# Patient Record
Sex: Female | Born: 1965 | Hispanic: No | Marital: Single | Smoking: Never smoker
Health system: Southern US, Community
[De-identification: ages and names within clinical notes are randomized; demographics above are authoritative.]

## PROBLEM LIST (undated history)

## (undated) HISTORY — PX: ABDOMINAL HYSTERECTOMY: SHX81

---

## 1999-02-03 ENCOUNTER — Other Ambulatory Visit: Admission: RE | Admit: 1999-02-03 | Discharge: 1999-02-03 | Payer: Self-pay | Admitting: Obstetrics and Gynecology

## 2000-03-10 ENCOUNTER — Other Ambulatory Visit: Admission: RE | Admit: 2000-03-10 | Discharge: 2000-03-10 | Payer: Self-pay | Admitting: *Deleted

## 2000-06-23 ENCOUNTER — Encounter: Payer: Self-pay | Admitting: Gastroenterology

## 2000-06-23 ENCOUNTER — Ambulatory Visit (HOSPITAL_COMMUNITY): Admission: RE | Admit: 2000-06-23 | Discharge: 2000-06-23 | Payer: Self-pay | Admitting: Gastroenterology

## 2000-09-19 ENCOUNTER — Emergency Department (HOSPITAL_COMMUNITY): Admission: EM | Admit: 2000-09-19 | Discharge: 2000-09-19 | Payer: Self-pay | Admitting: Emergency Medicine

## 2001-05-01 ENCOUNTER — Other Ambulatory Visit: Admission: RE | Admit: 2001-05-01 | Discharge: 2001-05-01 | Payer: Self-pay | Admitting: *Deleted

## 2001-08-17 ENCOUNTER — Encounter: Payer: Self-pay | Admitting: Occupational Medicine

## 2001-08-17 ENCOUNTER — Encounter: Admission: RE | Admit: 2001-08-17 | Discharge: 2001-08-17 | Payer: Self-pay | Admitting: Occupational Medicine

## 2001-08-23 ENCOUNTER — Encounter: Admission: RE | Admit: 2001-08-23 | Discharge: 2001-09-18 | Payer: Self-pay | Admitting: Occupational Medicine

## 2002-06-19 ENCOUNTER — Other Ambulatory Visit: Admission: RE | Admit: 2002-06-19 | Discharge: 2002-06-19 | Payer: Self-pay | Admitting: *Deleted

## 2002-11-06 ENCOUNTER — Encounter: Payer: Self-pay | Admitting: Family Medicine

## 2002-11-06 ENCOUNTER — Encounter: Admission: RE | Admit: 2002-11-06 | Discharge: 2002-11-06 | Payer: Self-pay | Admitting: Family Medicine

## 2003-08-06 ENCOUNTER — Other Ambulatory Visit: Admission: RE | Admit: 2003-08-06 | Discharge: 2003-08-06 | Payer: Self-pay | Admitting: *Deleted

## 2004-02-25 ENCOUNTER — Encounter: Admission: RE | Admit: 2004-02-25 | Discharge: 2004-02-25 | Payer: Self-pay | Admitting: *Deleted

## 2004-04-07 ENCOUNTER — Ambulatory Visit (HOSPITAL_COMMUNITY): Admission: RE | Admit: 2004-04-07 | Discharge: 2004-04-07 | Payer: Self-pay

## 2004-12-26 ENCOUNTER — Emergency Department (HOSPITAL_COMMUNITY): Admission: EM | Admit: 2004-12-26 | Discharge: 2004-12-26 | Payer: Self-pay | Admitting: Emergency Medicine

## 2005-09-23 ENCOUNTER — Ambulatory Visit (HOSPITAL_COMMUNITY): Admission: RE | Admit: 2005-09-23 | Discharge: 2005-09-23 | Payer: Self-pay | Admitting: Family Medicine

## 2006-11-06 ENCOUNTER — Ambulatory Visit: Payer: Self-pay | Admitting: Unknown Physician Specialty

## 2006-11-09 ENCOUNTER — Ambulatory Visit: Payer: Self-pay | Admitting: Unknown Physician Specialty

## 2006-12-25 ENCOUNTER — Ambulatory Visit: Payer: Self-pay | Admitting: Unknown Physician Specialty

## 2006-12-28 ENCOUNTER — Ambulatory Visit: Payer: Self-pay | Admitting: Unknown Physician Specialty

## 2007-02-08 ENCOUNTER — Encounter: Admission: RE | Admit: 2007-02-08 | Discharge: 2007-02-08 | Payer: Self-pay

## 2007-09-07 ENCOUNTER — Ambulatory Visit: Payer: Self-pay | Admitting: Pulmonary Disease

## 2007-09-07 DIAGNOSIS — R05 Cough: Secondary | ICD-10-CM

## 2007-09-07 DIAGNOSIS — J310 Chronic rhinitis: Secondary | ICD-10-CM

## 2007-10-03 ENCOUNTER — Encounter: Payer: Self-pay | Admitting: Pulmonary Disease

## 2007-11-07 ENCOUNTER — Encounter: Admission: RE | Admit: 2007-11-07 | Discharge: 2007-11-07 | Payer: Self-pay | Admitting: Nurse Practitioner

## 2008-03-19 ENCOUNTER — Encounter: Admission: RE | Admit: 2008-03-19 | Discharge: 2008-03-19 | Payer: Self-pay | Admitting: Internal Medicine

## 2008-04-07 ENCOUNTER — Other Ambulatory Visit: Admission: RE | Admit: 2008-04-07 | Discharge: 2008-04-07 | Payer: Self-pay | Admitting: Family Medicine

## 2009-06-12 ENCOUNTER — Ambulatory Visit: Payer: Self-pay | Admitting: Pulmonary Disease

## 2009-06-12 DIAGNOSIS — J019 Acute sinusitis, unspecified: Secondary | ICD-10-CM

## 2009-06-12 LAB — CONVERTED CEMR LAB: IgE (Immunoglobulin E), Serum: 4128.1 intl units/mL — ABNORMAL HIGH (ref 0.0–180.0)

## 2009-06-22 ENCOUNTER — Encounter: Payer: Self-pay | Admitting: Pulmonary Disease

## 2009-06-25 ENCOUNTER — Ambulatory Visit: Payer: Self-pay | Admitting: Pulmonary Disease

## 2009-06-25 ENCOUNTER — Encounter: Payer: Self-pay | Admitting: Pulmonary Disease

## 2009-06-25 DIAGNOSIS — R942 Abnormal results of pulmonary function studies: Secondary | ICD-10-CM

## 2009-06-26 ENCOUNTER — Telehealth (INDEPENDENT_AMBULATORY_CARE_PROVIDER_SITE_OTHER): Payer: Self-pay | Admitting: *Deleted

## 2009-06-26 ENCOUNTER — Encounter: Payer: Self-pay | Admitting: Pulmonary Disease

## 2009-06-29 ENCOUNTER — Ambulatory Visit: Payer: Self-pay | Admitting: Internal Medicine

## 2009-07-08 ENCOUNTER — Ambulatory Visit: Payer: Self-pay | Admitting: Pulmonary Disease

## 2009-08-07 ENCOUNTER — Encounter: Payer: Self-pay | Admitting: Pulmonary Disease

## 2009-08-10 ENCOUNTER — Encounter: Admission: RE | Admit: 2009-08-10 | Discharge: 2009-08-10 | Payer: Self-pay | Admitting: General Surgery

## 2010-06-06 ENCOUNTER — Encounter: Payer: Self-pay | Admitting: Unknown Physician Specialty

## 2010-06-15 NOTE — Letter (Signed)
Summary: [facility]  [facility]   Imported By: Sherian Rein 07/04/2009 11:24:38  _____________________________________________________________________  External Attachment:    Type:   Image     Comment:   External Document

## 2010-06-15 NOTE — Miscellaneous (Signed)
Summary: Pulmonary function test   Pulmonary Function Test Date: 06/25/2009 Height (in.): 60 Gender: Female  Pre-Spirometry FVC    Value: 2.00 L/min   Pred: 3.04 L/min     % Pred: 66 % FEV1    Value: 1.74 L     Pred: 2.36 L     % Pred: 74 % FEV1/FVC  Value: 87 %     Pred: 77 %     % Pred: . % FEF 25-75  Value: 2.74 L/min   Pred: 2.89 L/min     % Pred: 95 %  Post-Spirometry FVC    Value: 2.13 L/min   Pred: 3.04 L/min     % Pred: 70 % FEV1    Value: 1.89 L     Pred: 2.36 L     % Pred: 80 % FEV1/FVC  Value: 89 %     Pred: 77 %     % Pred: . % FEF 25-75  Value: 3.14 L/min   Pred: 2.89 L/min     % Pred: 109 %  Lung Volumes TLC    Value: 3.17 L   % Pred: 75 % RV    Value: 1.01 L   % Pred: 75 % DLCO    Value: 12.6 %   % Pred: 54 % DLCO/VA  Value: 4.77 %   % Pred: 113 %  Comments: No obstruction.  Mild restriction.  Moderate diffusion defect.  No bronchodilator response. Clinical Lists Changes  Observations: Added new observation of PFT COMMENTS: No obstruction.  Mild restriction.  Moderate diffusion defect.  No bronchodilator response. (06/25/2009 15:46) Added new observation of DLCO/VA%EXP: 113 % (06/25/2009 15:46) Added new observation of DLCO/VA: 4.77 % (06/25/2009 15:46) Added new observation of DLCO % EXPEC: 54 % (06/25/2009 15:46) Added new observation of DLCO: 12.6 % (06/25/2009 15:46) Added new observation of RV % EXPECT: 75 % (06/25/2009 15:46) Added new observation of RV: 1.01 L (06/25/2009 15:46) Added new observation of TLC % EXPECT: 75 % (06/25/2009 15:46) Added new observation of TLC: 3.17 L (06/25/2009 15:46) Added new observation of FEF2575%EXPS: 109 % (06/25/2009 15:46) Added new observation of PSTFEF25/75P: 2.89  (06/25/2009 15:46) Added new observation of PSTFEF25/75%: 3.14 L/min (06/25/2009 15:46) Added new observation of PSTFEV1/FCV%: . % (06/25/2009 15:46) Added new observation of FEV1FVCPRDPS: 77 % (06/25/2009 15:46) Added new observation of PSTFEV1/FVC:  89 % (06/25/2009 15:46) Added new observation of POSTFEV1%PRD: 80 % (06/25/2009 15:46) Added new observation of FEV1PRDPST: 2.36 L (06/25/2009 15:46) Added new observation of POST FEV1: 1.89 L/min (06/25/2009 15:46) Added new observation of POST FVC%EXP: 70 % (06/25/2009 15:46) Added new observation of FVCPRDPST: 3.04 L/min (06/25/2009 15:46) Added new observation of POST FVC: 2.13 L (06/25/2009 15:46) Added new observation of FEF % EXPEC: 95 % (06/25/2009 15:46) Added new observation of FEF25-75%PRE: 2.89 L/min (06/25/2009 15:46) Added new observation of FEF 25-75%: 2.74 L/min (06/25/2009 15:46) Added new observation of FEV1/FVC%EXP: . % (06/25/2009 15:46) Added new observation of FEV1/FVC PRE: 77 % (06/25/2009 15:46) Added new observation of FEV1/FVC: 87 % (06/25/2009 15:46) Added new observation of FEV1 % EXP: 74 % (06/25/2009 15:46) Added new observation of FEV1 PREDICT: 2.36 L (06/25/2009 15:46) Added new observation of FEV1: 1.74 L (06/25/2009 15:46) Added new observation of FVC % EXPECT: 66 % (06/25/2009 15:46) Added new observation of FVC PREDICT: 3.04 L (06/25/2009 15:46) Added new observation of FVC: 2.00 L (06/25/2009 15:46) Added new observation of PFT HEIGHT: 60  (06/25/2009 15:46) Added new observation of PFT DATE:  06/25/2009  (06/25/2009 15:46) 

## 2010-06-15 NOTE — Letter (Signed)
Summary: [facility]  [facility]   Imported By: Sherian Rein 06/26/2009 13:53:58  _____________________________________________________________________  External Attachment:    Type:   Image     Comment:   External Document

## 2010-06-15 NOTE — Assessment & Plan Note (Signed)
Summary: rov after pft ///kp   Copy to:  Elvina Sidle  CC:  Cough/sinus follow-up with PFT's. .  History of Present Illness: 45 yo female with chronic cough.  She was seen by ENT and allergy.  These evaluations were relatively unremarkable.  She remembers have girardia and tapeworm in her stool test from December.    She still gets some chest tightness, and cough with yellow sputum.  Her sinuses are improved.  She has an asmanex inhaler from allergist, but has not used yet.  She has not used her albuterol either.    Current Medications (verified): 1)  Nasonex 50 Mcg/act Susp (Mometasone Furoate) .... Two Sprays Once Daily 2)  Augmentin 875-125 Mg Tabs (Amoxicillin-Pot Clavulanate) .Marland Kitchen.. 1 By Mouth Two Times A Day  Allergies (verified): No Known Drug Allergies  Past History:  Past Surgical History: Last updated: 06/12/2009 None  Past Medical History: Seasonal allergies Beta thalessemia Cough      - PFT 06/25/09 FVC 2.13(70%), FEV1 1.89(80%), FEV1% 89, TLC 3.17(75%), DLCO 54%, no BD       - RAST positive 06/12/09 with IgE 4128  Vital Signs:  Patient profile:   46 year old female Height:      60 inches (152.40 cm) Weight:      180 pounds (81.82 kg) BMI:     35.28 O2 Sat:      96 % on Room air Temp:     98.0 degrees F (36.67 degrees C) oral Pulse rate:   111 / minute BP sitting:   110 / 66  (left arm) Cuff size:   regular  Vitals Entered By: Michel Bickers CMA (June 25, 2009 1:23 PM)  O2 Sat at Rest %:  96 O2 Flow:  Room air  Physical Exam  General:  obese.   Nose:  clear nasal discharge, no tenderness Mouth:  MP 4, mild erythema Neck:  no JVD.   Lungs:  coarse breath sounds, no wheezing or rales. Heart:  regular rate and rhythm, S1, S2 without murmurs, rubs, gallops, or clicks Abdomen:  bowel sounds positive; abdomen soft and non-tender without masses, or organomegaly Extremities:  no clubbing, cyanosis, edema, or deformity noted Cervical Nodes:  no  significant adenopathy   Impression & Recommendations:  Problem # 1:  SINUSITIS, ACUTE (ICD-461.9) She is to finish her course of augmentin.  Problem # 2:  CHRONIC RHINITIS (ICD-472.0) She is to continue her sinus regimen.  Problem # 3:  COUGH (ICD-786.2) Has partial improvement after treatment of sinusitis.  She may have component of asthma also.  Advised to try using albuterol inhaler, and then decide if she should also use asmanex.  Problem # 4:  PULMONARY FUNCTION TESTS, ABNORMAL (ICD-794.2) She has mild restriction and moderate diffusion defect on PFT.  Will schedule HRCT chest to further evaluate possible intrinsic lung disease.  Problem # 5:  GIARDIASIS (ICD-007.1) Have asked her to get copies of her stool test, and which anti-parasitic agent she was on before.  Will repeat her stool studies.  Will need to repeat her IgE at some point, and may need further evaluation from ID.  Medications Added to Medication List This Visit: 1)  Augmentin 875-125 Mg Tabs (Amoxicillin-pot clavulanate) .Marland Kitchen.. 1 by mouth two times a day 2)  Stool For Ova and Parasites  3)  Asmanex 60 Metered Doses 220 Mcg/inh Aepb (Mometasone furoate) .... Two puffs at bedtime 4)  Proventil Hfa 108 (90 Base) Mcg/act Aers (Albuterol sulfate) .... Two puffs up to  four times per day as needed  Complete Medication List: 1)  Nasonex 50 Mcg/act Susp (Mometasone furoate) .... Two sprays once daily 2)  Augmentin 875-125 Mg Tabs (Amoxicillin-pot clavulanate) .Marland Kitchen.. 1 by mouth two times a day 3)  Stool For Ova and Parasites  4)  Asmanex 60 Metered Doses 220 Mcg/inh Aepb (Mometasone furoate) .... Two puffs at bedtime 5)  Proventil Hfa 108 (90 Base) Mcg/act Aers (Albuterol sulfate) .... Two puffs up to four times per day as needed  Other Orders: Est. Patient Level III ([address]109) Radiology Referral (Radiology)  Patient Instructions: 1)  Stool sample test 2)  Will schedule CT chest 3)  Follow up in 2  weeks Prescriptions: STOOL FOR OVA AND PARASITES   #1 x 0   Entered and Authorized by:   Coralyn Helling MD   Signed by:   Coralyn Helling MD on 06/25/2009   Method used:   Print then Give to Patient   RxID:   [id number]

## 2010-06-15 NOTE — Assessment & Plan Note (Signed)
Summary: ROV/ MBW   Copy to:  Elvina Sidle  CC:  Cough and allergic rhinitis follow-up. The patient c/o sinus congestion and cough with yellowish mucus production. She is using no daily medications..  History of Present Illness: 45 yo female with chronic cough.  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  She thinks she was exposed to something from her work, but does not think she has TB.  She has been getting sinus congestion, and pain around her frontal sinuses.  She has been blowing her nose, and getting thick, yellow drainage.  She has been getting post-nasal drip, and coughs up the phlegm.  She denies fever, sweats, chills, hemoptysis, chest pain, or weight loss.  She has been feeling winded with strenuous exertion, but denies wheezing.  She recently went on a trip to [place], [place], and the [place].  She has not gotten her flu shot yet this year.   Current Medications (verified): 1)  No Daily Medications  Allergies (verified): No Known Drug Allergies  Past History:  Past Medical History: Last updated: 09/07/2007 Seasonal allergies  Past Surgical History: None  Family History: Reviewed history from 09/07/2007 and no changes required. Patient is adopted. Has twin brother  Social History: Patient never smoked.  Patient works as a Management consultant. TB clinic.  Vital Signs:  Patient profile:   45 year old female Height:      64 inches (162.56 cm) Weight:      181.38 pounds (82.45 kg) BMI:     31.25 O2 Sat:      98 % on Room air Temp:     97.9 degrees F (36.61 degrees C) oral Pulse rate:   104 / minute BP sitting:   112 / 80  (left arm) Cuff size:   regular  Vitals Entered By: Michel Bickers CMA (June 12, 2009 3:03 PM)  O2 Sat at Rest %:  98 O2 Flow:  Room air  Physical Exam  General:  obese.   Ears:  TMs intact and clear with normal canals Nose:  no pain, thick, yellow secretions Mouth:  MP 4, mild erythema Neck:  no JVD.   Lungs:   coarse breath sounds, no wheezing or rales. Heart:  regular rate and rhythm, S1, S2 without murmurs, rubs, gallops, or clicks Abdomen:  bowel sounds positive; abdomen soft and non-tender without masses, or organomegaly Extremities:  no clubbing, cyanosis, edema, or deformity noted Cervical Nodes:  no significant adenopathy   Impression & Recommendations:  Problem # 1:  SINUSITIS, ACUTE (ICD-461.9) Will give her a one week course of augmentin.  Advised that if her symptoms persist, then she should refill her script for augmentin.  Problem # 2:  CHRONIC RHINITIS (ICD-472.0) Advised her to use nasal irrigation and nasonex on a daily basis.  Asked her to forward copies of her prior sinus imaging studies.  Will also arrange for RAST and IgE.  She may need CT of her sinuses, and possible ENT evaluation.  Problem # 3:  COUGH (ICD-786.2) Likely related to allergies and sinusitis with post-nasal drip.  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  My suspicion for TB is quite low.  Will arrange for chest xray to further assess.  Will also arrange for PFT's to evaluate the possibilty of asthma.  Advised that she should get annual flu shots given her work exposure.  Medications Added to Medication List This Visit: 1)  No Daily Medications  2)  Nasonex 50 Mcg/act Susp (Mometasone furoate) .... Two sprays once daily 3)  Augmentin 875-125 Mg Tabs (Amoxicillin-pot clavulanate) .... One by mouth two times a day  Complete Medication List: 1)  No Daily Medications  2)  Nasonex 50 Mcg/act Susp (Mometasone furoate) .... Two sprays once daily 3)  Augmentin 875-125 Mg Tabs (Amoxicillin-pot clavulanate) .... One by mouth two times a day  Other Orders: Est. Patient Level III (99213) T-2 View CXR (71020TC) TLB-BMP (Basic Metabolic Panel-BMET) (80048-METABOL) TLB-CBC Platelet - w/Differential (85025-CBCD) TLB-Hepatic/Liver Function Pnl (80076-HEPATIC) T-"RAST" (Allergy Full  Profile) IGE (16109-60454) Full Pulmonary Function Test (PFT)  Patient Instructions: 1)  Augmentin one by mouth two times a day for 7 days, and refill if no better 2)  Nasal irrigation once daily  3)  Nasonex two sprays once daily after nasal irrigation 4)  chest xray today 5)  Will schedule breathing test 6)  Lab test today 7)  Follow up in 2 to 3 weeks Prescriptions: AUGMENTIN 875-125 MG TABS (AMOXICILLIN-POT CLAVULANATE) one by mouth two times a day  #14 x 2   Entered and Authorized by:   Coralyn Helling MD   Signed by:   Coralyn Helling MD on 06/12/2009   Method used:   Electronically to        [facility] (retail)       [address]       [place], [place]  [address]       Ph: [phone] or [phone]       Fax: [phone]   RxID:   [id number] NASONEX 50 MCG/ACT SUSP (MOMETASONE FUROATE) two sprays once daily  #1 x 3   Entered and Authorized by:   Coralyn Helling MD   Signed by:   Coralyn Helling MD on 06/12/2009   Method used:   Electronically to        [facility] (retail)       [address]       [place], [place]  [address]       Ph: [phone] or [phone]       Fax: [phone]   RxID:   [id number]    Immunization History:  Influenza Immunization History:    Influenza:  historical (03/30/2008)

## 2010-06-15 NOTE — Letter (Signed)
Summary: [facility]  [facility]   Imported By: Sherian Rein 08/25/2009 09:45:05  _____________________________________________________________________  External Attachment:    Type:   Image     Comment:   External Document

## 2010-06-15 NOTE — Miscellaneous (Signed)
Summary: Orders Update pft charges  Clinical Lists Changes  Orders: Added new Service order of Carbon Monoxide diffusing w/capacity (94720) - Signed Added new Service order of Lung Volumes (94240) - Signed Added new Service order of Spirometry (Pre & Post) (94060) - Signed 

## 2010-06-15 NOTE — Assessment & Plan Note (Signed)
Summary: rov 2 wks ///kp   Copy to:     CC:  2 weeks follow up visit-review results of CT.  History of Present Illness: 45 yo female with chronic cough.  She has been feeling better with her breathing.  She is not using any inhalers at present.  She is not having cough, wheeze, sputum, fever, dyspnea, chest pain, or hemoptysis.  She is being followed by primary care for her lab test results.  Current Medications (verified): 1)  Asmanex 60 Metered Doses 220 Mcg/inh Aepb (Mometasone Furoate) .... Two Puffs At Bedtime 2)  Proventil Hfa 108 (90 Base) Mcg/act Aers (Albuterol Sulfate) .... Two Puffs Up To Four Times Per Day As Needed  Allergies (verified): 1)  ! Darvocet 2)  ! 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 None  Vital Signs:  Patient profile:   45 year old female Height:      60 inches Weight:      182.38 pounds BMI:     35.75 O2 Sat:      98 % on Room air Temp:     98.6 degrees F oral Pulse rate:   87 / minute BP sitting:   110 / 72  (left arm) Cuff size:   regular  Vitals Entered By: Reynaldo Minium CMA (July 08, 2009 4:28 PM)  O2 Flow:  Room air  Physical Exam  General:  obese.   Nose:  no deformity, discharge, inflammation, or lesions Mouth:  MP 4, 2+ tonsills, no oral lesion Neck:  no masses, thyromegaly, or abnormal cervical nodes Lungs:  clear bilaterally to auscultation and percussion Heart:  regular rate and rhythm, S1, S2 without murmurs, rubs, gallops, or clicks Extremities:  no clubbing, cyanosis, edema, or deformity noted Cervical Nodes:  no significant adenopathy   Impression & Recommendations:  Problem # 1:  PULMONARY FUNCTION TESTS,  ABNORMAL (ICD-794.2)  He CT chest was unremarkable.  I suspect her pulmonary function findings are related to her body habitus.  No further pulmonary testing is needed for this at present.  Orders: Est. Patient Level III (23557)  Problem # 2:  COUGH (ICD-786.2)  She likely has mild, intermittent asthma that was exacerbated by her recent sinus infection.  I don't think she needs specific therapy for her asthma at present.  She will follow up with pulmonary as needed if her symptoms progress.  Orders: Est. Patient Level III (32202)  Problem # 3:  SINUSITIS, ACUTE (ICD-461.9)  This has resolved and she is off antibiotics now.  Orders: Est. Patient Level III (54270)  Complete Medication List: 1)  Proventil Hfa 108 (90 Base) Mcg/act Aers (Albuterol sulfate) .... Two puffs up to four times per day as needed  Patient Instructions: 1)  Follow up as needed

## 2010-06-15 NOTE — Progress Notes (Signed)
Summary: abx question  Phone Note Call from Patient   Caller: Patient Call For: sood Summary of Call: pt wants to know if the abx she is taking will kill MAIC organisms. (she is a Research scientist (physical sciences)). ok to leave msg on her phone [phone] note: pt was seen yesterday and forgot to ask this question. Initial call taken by: Tivis Ringer, CNA,  June 26, 2009 8:52 AM  Follow-up for Phone Call        I advised pt VS is out of the office until this PM and pt advised ok to wait for his response. Pt also advised she has the information VS wanted (but did not provide) and will bring in same when she can get to our office. Please advise. Thanks. Zackery Barefoot CMA  June 26, 2009 9:03 AM   Additional Follow-up for Phone Call Additional follow up Details #1::        Can advise that antibiotic she is currently using dose not usually work very well for MAI or GI infections.  Her current antibiotic is more to treat her sinus problems. Additional Follow-up by: Coralyn Helling MD,  June 26, 2009 12:11 PM     Appended Document: abx question Msg left for pt with VS's response.

## 2011-02-05 IMAGING — RF DG ESOPHAGUS
12 of 15 series · 19 of 24 positions shown · non-contrast
Comparison: None.

CLINICAL DATA: Difficulty swallowing

ESOPHOGRAM/BARIUM SWALLOW
TECHNIQUE: Combined double contrast and single contrast
examination performed using effervescent crystals, thick barium
liquid, and thin barium liquid.
Fluoroscopy time:  1.8 minutes.

[Series 3: run · 1 of 1 slices shown (1 of 12)]
[im 1/1]
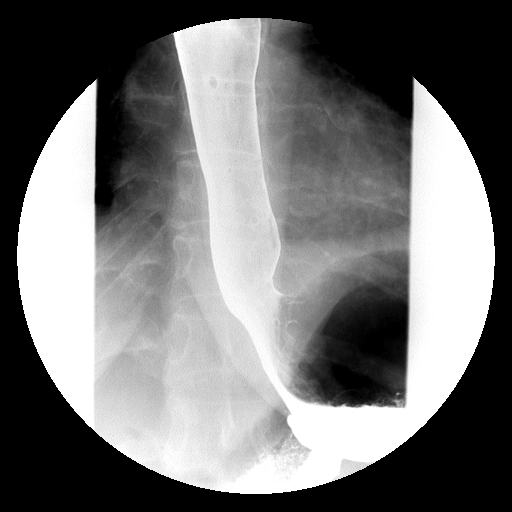

[Series 4: run · 1 of 1 slices shown (2 of 12)]
[im 1/1]
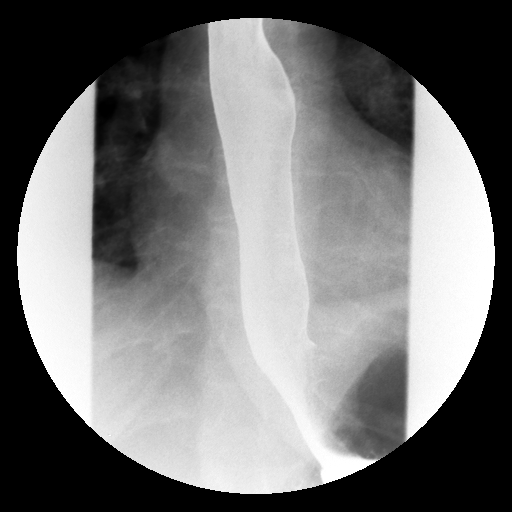

[Series 7: run · 1 of 1 slices shown (3 of 12)]
[im 1/1]
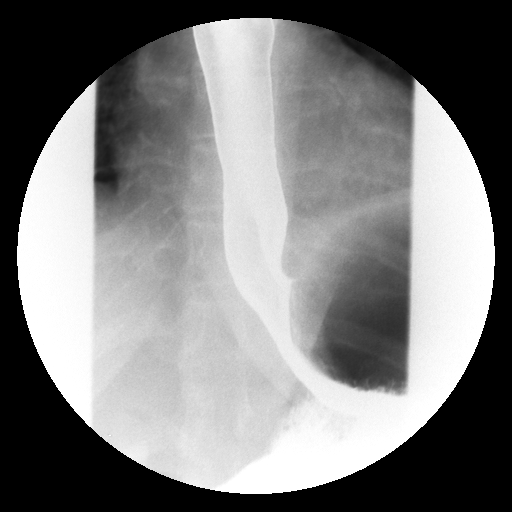

[Series 8: run · 5 of 10 slices shown (4 of 12)]
[im 1/10]
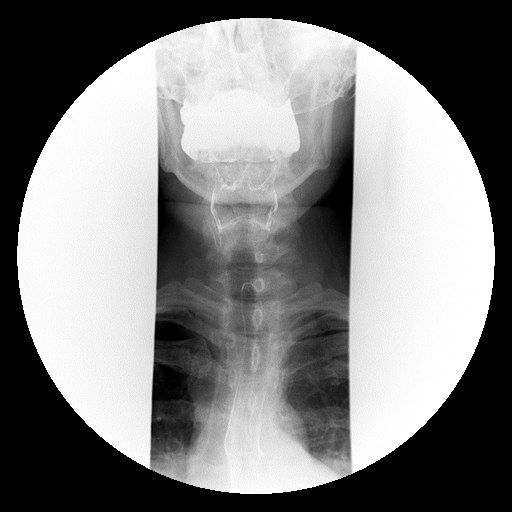
[im 2/10]
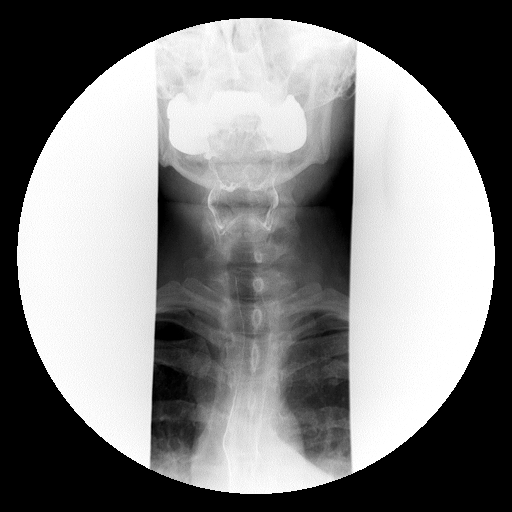
[im 4/10]
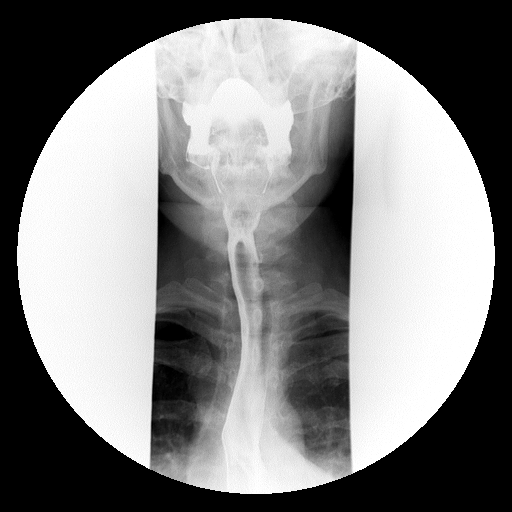
[im 8/10]
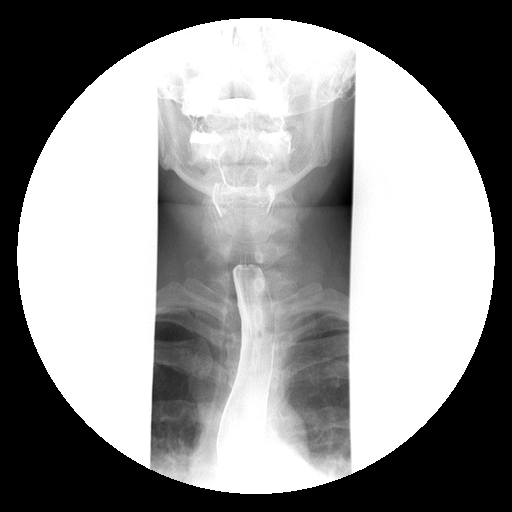
[im 10/10]
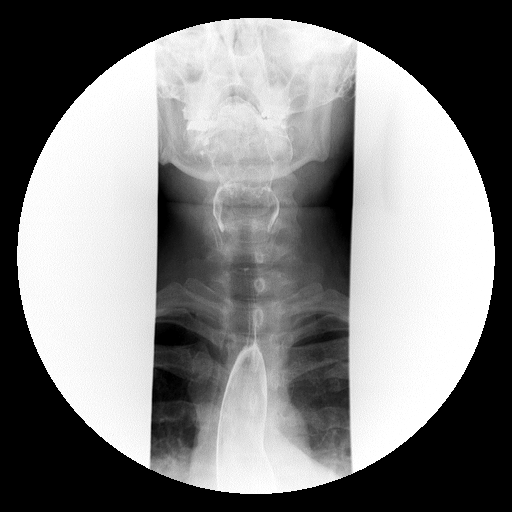

[Series 9: run · 4 of 9 slices shown (5 of 12)]
[im 1/9]
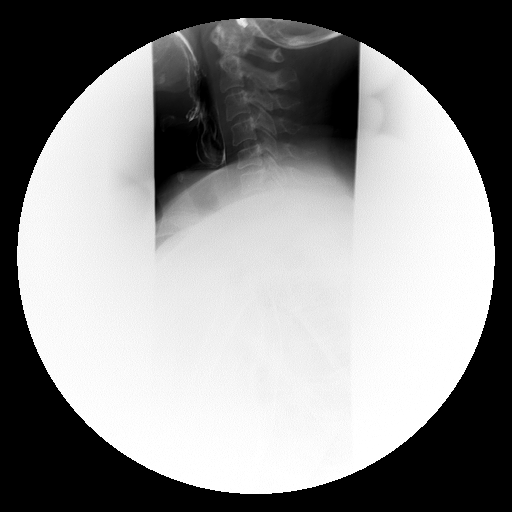
[im 5/9]
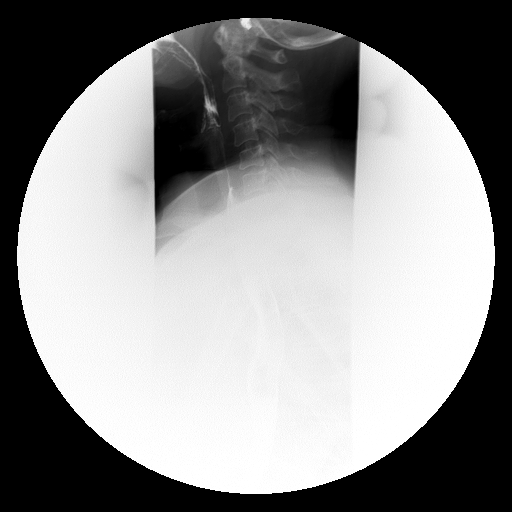
[im 7/9]
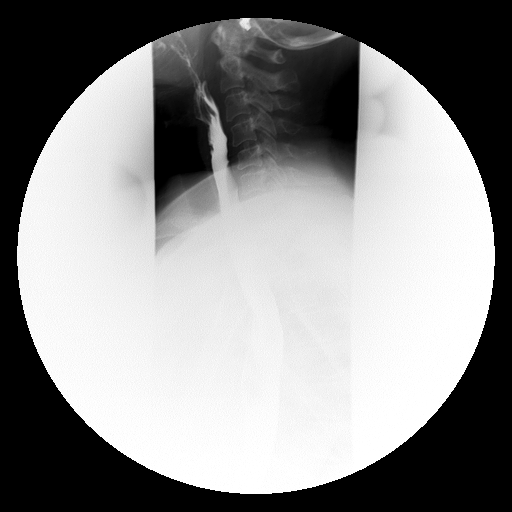
[im 9/9]
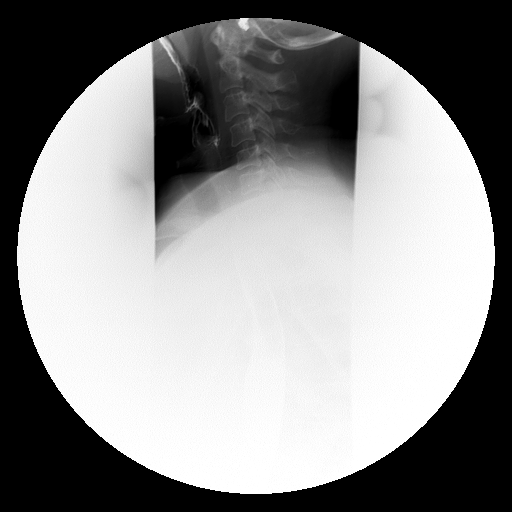

[Series 10: run · 1 of 1 slices shown (6 of 12)]
[im 1/1]
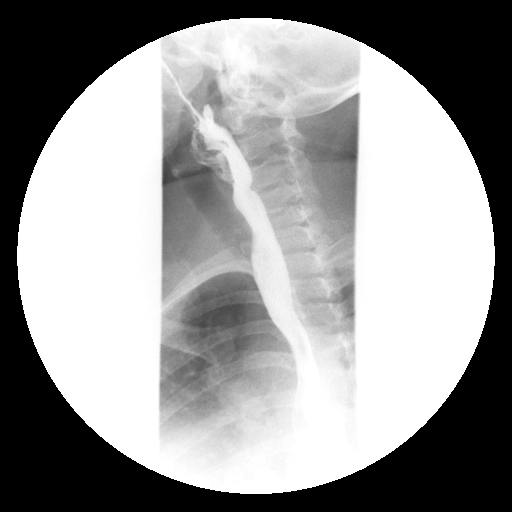

[Series 13: run · 1 of 1 slices shown (7 of 12)]
[im 1/1]
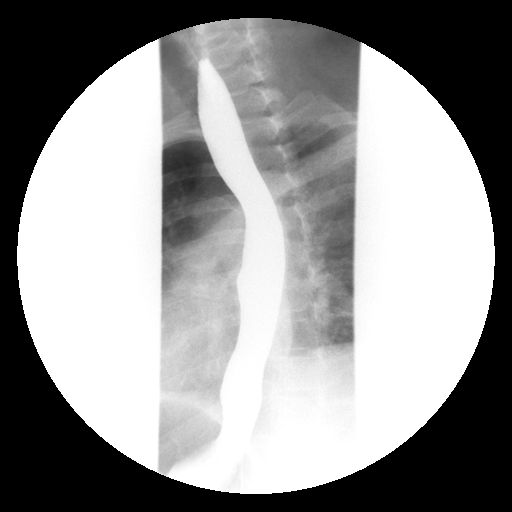

[Series 14: run · 1 of 1 slices shown (8 of 12)]
[im 1/1]
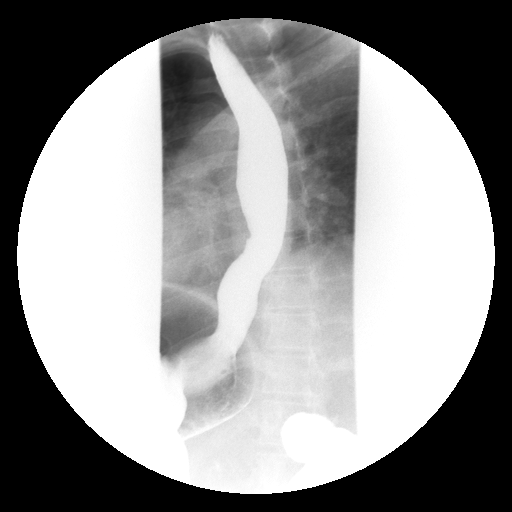

[Series 15: run · 1 of 1 slices shown (9 of 12)]
[im 1/1]
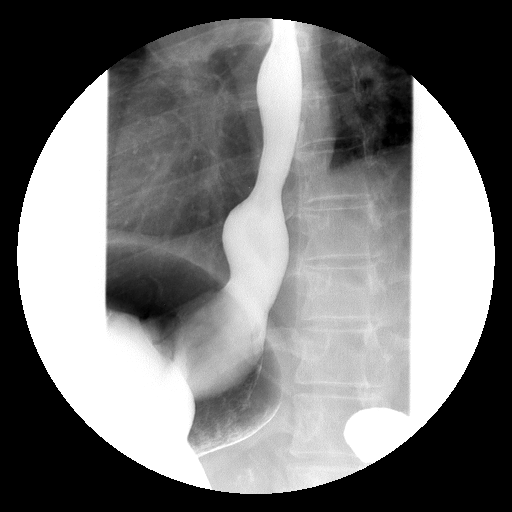

[Series 16: run · 1 of 1 slices shown (10 of 12)]
[im 1/1]
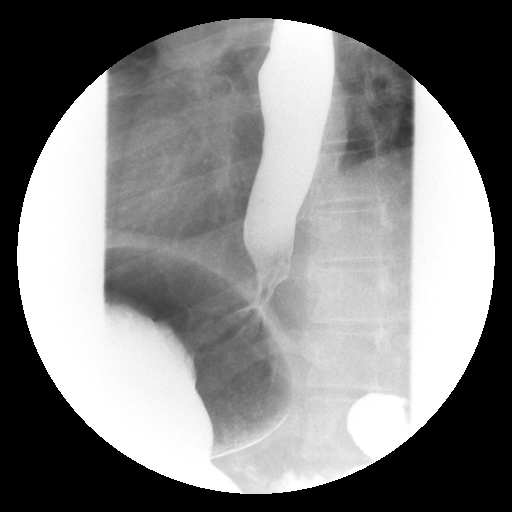

[Series 18: run · 1 of 1 slices shown (11 of 12)]
[im 1/1]
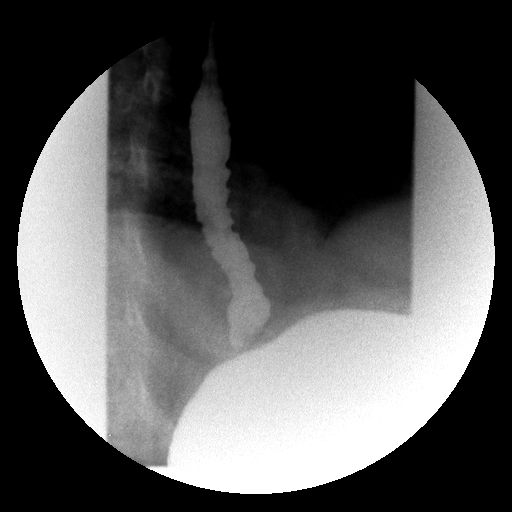

[Series 19: run · 1 of 1 slices shown (12 of 12)]
[im 1/1]
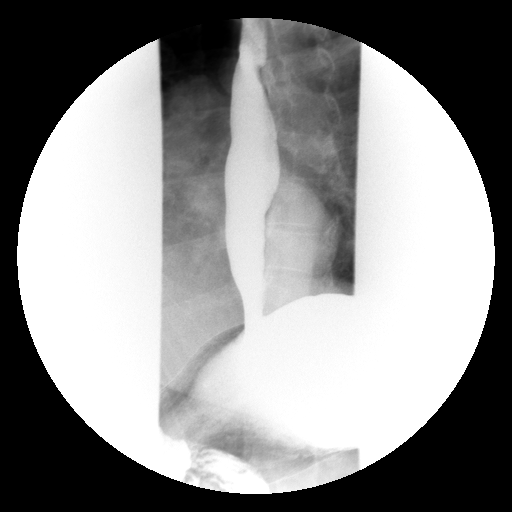

[19 of 24 positions shown; findings below may reference images not displayed]

FINDINGS: A double contrast study was performed.  The mucosa of
the esophagus appears normal.  Rapid sequence spot films of the
cervical esophagus show a normal neuromuscular swallowing
mechanism. There is slight prominence of the cricopharyngeus
muscle.  Esophageal peristalsis is normal although there are mild
tertiary contractions distally.  No hiatal hernia is seen.
Significant gastroesophageal reflux is noted.  A barium pill was
given at the end of the study which passed into the stomach without
delay.
IMPRESSION: 1.  Significant gastroesophageal reflux.  Barium pill passes into
the stomach without delay.
2.  Mild tertiary contractions in the distal esophagus.

## 2014-08-13 ENCOUNTER — Telehealth: Payer: Self-pay

## 2014-08-13 NOTE — Telephone Encounter (Signed)
08/13/14 Disc Received from [facility] and filed on shelf.Jeneen Rinks/IH

## 2019-10-13 ENCOUNTER — Other Ambulatory Visit: Payer: Self-pay

## 2019-10-13 ENCOUNTER — Emergency Department (HOSPITAL_BASED_OUTPATIENT_CLINIC_OR_DEPARTMENT_OTHER)
Admission: EM | Admit: 2019-10-13 | Discharge: 2019-10-13 | Disposition: A | Discharge status: Home / Self Care | Payer: Self-pay | Attending: Emergency Medicine | Admitting: Emergency Medicine

## 2019-10-13 ENCOUNTER — Encounter (HOSPITAL_BASED_OUTPATIENT_CLINIC_OR_DEPARTMENT_OTHER): Payer: Self-pay

## 2019-10-13 DIAGNOSIS — R21 Rash and other nonspecific skin eruption: Secondary | ICD-10-CM | POA: Insufficient documentation

## 2019-10-13 DIAGNOSIS — Z885 Allergy status to narcotic agent status: Secondary | ICD-10-CM | POA: Insufficient documentation

## 2019-10-13 DIAGNOSIS — Z888 Allergy status to other drugs, medicaments and biological substances status: Secondary | ICD-10-CM | POA: Insufficient documentation

## 2019-10-13 DIAGNOSIS — Z886 Allergy status to analgesic agent status: Secondary | ICD-10-CM | POA: Insufficient documentation

## 2019-10-13 DIAGNOSIS — L299 Pruritus, unspecified: Secondary | ICD-10-CM | POA: Insufficient documentation

## 2019-10-13 LAB — COMPREHENSIVE METABOLIC PANEL
ALT: 20 U/L (ref 0–44)
AST: 17 U/L (ref 15–41)
Albumin: 4.1 g/dL (ref 3.5–5.0)
Alkaline Phosphatase: 90 U/L (ref 38–126)
Anion gap: 10 (ref 5–15)
BUN: 19 mg/dL (ref 6–20)
CO2: 28 mmol/L (ref 22–32)
Calcium: 9 mg/dL (ref 8.9–10.3)
Chloride: 101 mmol/L (ref 98–111)
Creatinine, Ser: 0.83 mg/dL (ref 0.44–1.00)
GFR calc Af Amer: >60 mL/min (ref >60)
GFR calc non Af Amer: >60 mL/min (ref >60)
Glucose, Bld: 185 mg/dL — ABNORMAL HIGH (ref 70–99)
Potassium: 3.4 mmol/L — ABNORMAL LOW (ref 3.5–5.1)
Sodium: 139 mmol/L (ref 135–145)
Total Bilirubin: 0.6 mg/dL (ref 0.3–1.2)
Total Protein: 8.1 g/dL (ref 6.5–8.1)

## 2019-10-13 LAB — CBC WITH DIFFERENTIAL/PLATELET
Abs Immature Granulocytes: 0.11 10*3/uL — ABNORMAL HIGH (ref 0.00–0.07)
Basophils Absolute: 0.1 10*3/uL (ref 0.0–0.1)
Basophils Relative: 1 %
Eosinophils Absolute: 0.1 10*3/uL (ref 0.0–0.5)
Eosinophils Relative: 1 %
HCT: 43.7 % (ref 36.0–46.0)
Hemoglobin: 12.8 g/dL (ref 12.0–15.0)
Immature Granulocytes: 1 %
Lymphocytes Relative: 28 %
Lymphs Abs: 4.9 10*3/uL — ABNORMAL HIGH (ref 0.7–4.0)
MCH: 18.5 pg — ABNORMAL LOW (ref 26.0–34.0)
MCHC: 29.3 g/dL — ABNORMAL LOW (ref 30.0–36.0)
MCV: 63.1 fL — ABNORMAL LOW (ref 80.0–100.0)
Monocytes Absolute: 0.7 10*3/uL (ref 0.1–1.0)
Monocytes Relative: 4 %
Neutro Abs: 11.6 10*3/uL — ABNORMAL HIGH (ref 1.7–7.7)
Neutrophils Relative %: 65 %
Platelets: 359 10*3/uL (ref 150–400)
RBC: 6.93 MIL/uL — ABNORMAL HIGH (ref 3.87–5.11)
RDW: 18.7 % — ABNORMAL HIGH (ref 11.5–15.5)
Smear Review: NORMAL
WBC: 17.5 10*3/uL — ABNORMAL HIGH (ref 4.0–10.5)
nRBC: 0 % (ref 0.0–0.2)

## 2019-10-13 MED ORDER — PREDNISONE 20 MG PO TABS: 60.0000 mg | ORAL_TABLET | Freq: Every day | ORAL | 0 refills | Status: AC

## 2019-10-13 MED ORDER — HYDROXYZINE HCL 25 MG PO TABS: 25.0000 mg | ORAL_TABLET | Freq: Four times a day (QID) | ORAL | 0 refills | Status: AC

## 2019-10-13 NOTE — ED Provider Notes (Signed)
[facility] EMERGENCY DEPARTMENT Provider Note   CSN: [id number] Arrival date & time: 10/13/19  1332     History Chief Complaint  Patient presents with  . Rash    KHIARA SHUPING is a 54 y.o. female.  SERENIDY WALTZ is a 54 y.o. female with a history of chronic rhinitis, and abdominal hysterectomy, who presents to the emergency department for evaluation of rash.  Rash has been present for about 2 weeks.  It first started over her elbows, initially she noticed some red raised bumps with a dark center and at first the rash was not itchy or bothersome but a few days later the rash became extremely pruritic.  She reports that the itching has been intermittent and the rash seems to be moving, she has noticed that it has spread down her arms to her wrist, the rash does not extend onto the palms of the hands.  She has noticed small red raised bumps, and has noted that some pustular lesions have developed over her elbows, but she has not noted any drainage from the rash.  She reports that she is also developed similar rash to the backs of her ankles that has been extremely pruritic, and similar pustular lesions have developed here as well.  No rash noted to the soles of the feet.  She reports that the rash is not painful, but more so feels hypersensitive to the touch.  She has developed rash going up her legs and onto her thighs as well, primarily with the small red bumps.  She has not had any associated fevers or chills, no body aches, arthralgias or general malaise.  No cough, congestion, vomiting or diarrhea.  No known insect bites.  No one else in the home with similar rash.  States that she was seen at a free clinic on Wednesday and was prescribed a prednisone taper starting at 30 mg, which she has not seen much improvement with.  She has been taking Zyrtec for itching without much improvement.  She states the only thing that seems to temporarily relieve the itching is taking a very hot shower.   She has an appointment in 2 days with a dermatologist, but became concerned about itching and came here today with the hopes of seeing a specialist sooner.        History reviewed. No pertinent past medical history.  Patient Active Problem List   Diagnosis Date Noted  . PULMONARY FUNCTION TESTS, ABNORMAL 06/25/2009  . SINUSITIS, ACUTE 06/12/2009  . CHRONIC RHINITIS 09/07/2007  . COUGH 09/07/2007    Past Surgical History:  Procedure Laterality Date  . ABDOMINAL HYSTERECTOMY       OB History   No obstetric history on file.     No family history on file.  Social History   Tobacco Use  . Smoking status: Never Smoker  . Smokeless tobacco: Never Used  Substance Use Topics  . Alcohol use: Never  . Drug use: Never    Home Medications Prior to Admission medications   Not on File    Allergies    Propoxyphene n-acetaminophen and Tramadol hcl  Review of Systems   Review of Systems  Constitutional: Negative for chills and fever.  HENT: Negative for congestion, mouth sores and rhinorrhea.   Respiratory: Negative for cough and shortness of breath.   Cardiovascular: Negative for chest pain.  Gastrointestinal: Negative for abdominal pain, diarrhea, nausea and vomiting.  Musculoskeletal: Negative for arthralgias and myalgias.  Skin: Positive for rash.  Neurological: Negative for headaches.  All other systems reviewed and are negative.   Physical Exam Updated Vital Signs BP 121/77 (BP Location: Right Arm)   Pulse 100   Temp 97.9 F (36.6 C) (Oral)   Resp 18   Ht 5' (1.524 m)   Wt 85.2 kg   SpO2 98%   BMI 36.68 kg/m   Physical Exam Vitals and nursing note reviewed.  Constitutional:      General: She is not in acute distress.    Appearance: Normal appearance. She is well-developed. She is not ill-appearing or diaphoretic.     Comments: Well appearing and in no distress  HENT:     Head: Normocephalic and atraumatic.     Comments: No rash noted on the  face Eyes:     General:        Right eye: No discharge.        Left eye: No discharge.     Conjunctiva/sclera: Conjunctivae normal.  Pulmonary:     Effort: Pulmonary effort is normal. No respiratory distress.     Comments: No rash noted over the chest wall Abdominal:     Comments: No rash noted over the abdomen  Musculoskeletal:        General: No deformity.     Cervical back: Neck supple.  Skin:    General: Skin is warm and dry.     Findings: Rash present.     Comments: Rash extending from the elbows forearms with fine erythematous lesions that are not blanching concerning for potential petechial rash, they do feel somewhat raised, and may be inflammatory as well.  Pustular appearing lesions with some central umbilication noted over the elbows as well as the ankles with similar fine erythematous rash over the ankles and feet, rash does not involve the palms or soles.  No expressible drainage from pustular lesions.  Rash is not warm to the touch.  Pruritic and hypersensitive.  Does not involve the face, trunk or mucosa  Neurological:     Mental Status: She is alert.     Coordination: Coordination normal.  Psychiatric:        Mood and Affect: Mood normal.        Behavior: Behavior normal.     ED Results / Procedures / Treatments   Labs (all labs ordered are listed, but only abnormal results are displayed) Labs Reviewed  COMPREHENSIVE METABOLIC PANEL - Abnormal; Notable for the following components:      Result Value   Potassium 3.4 (*)    Glucose, Bld 185 (*)    All other components within normal limits  CBC WITH DIFFERENTIAL/PLATELET - Abnormal; Notable for the following components:   WBC 17.5 (*)    RBC 6.93 (*)    MCV 63.1 (*)    MCH 18.5 (*)    MCHC 29.3 (*)    RDW 18.7 (*)    Neutro Abs 11.6 (*)    Lymphs Abs 4.9 (*)    Abs Immature Granulocytes 0.11 (*)    All other components within normal limits    EKG None  Radiology No results  found.  Procedures Procedures (including critical care time)  Medications Ordered in ED Medications - No data to display  ED Course  I have reviewed the triage vital signs and the nursing notes.  Pertinent labs & imaging results that were available during my care of the patient were reviewed by me and considered in my medical decision making (see chart for details).  MDM Rules/Calculators/A&P                     54 year old female presents with rash that has been present for 2 weeks.  Started on elbows, has spread down upper extremities and is also present on her legs, rash spares the trunk, face, mucosa, palms and soles.  No associated fevers or systemic symptoms.  Was seen at the free clinic in [place] on Wednesday and was started on a low-dose steroid taper.  She reports minimal improvement in rash and continued itching, has been back without much improvement.  Rash fine red lesions that do not blanch concerning for potential petechiae, but with some pustular lesions noted also that are nondraining question whether this could be contact dermatitis.  Patient with no specific new exposures but does have multiple food allergies.  No associated shortness of breath, facial swelling and rash has been ongoing for 2 weeks at this point.  Low suspicion for anaphylaxis.  Given concern for petechiae will check basic labs.  Patient has a follow-up appointment with dermatologist in 2 days fortunately for further evaluation.  She came today hoping to see a dermatologist sooner, but I discussed with her that we do not have dermatologist available for consult in the ED.  Labs show a leukocytosis of 17.5, not surprising given the patient has been on 5-day steroid taper.  Normal hemoglobin, normal platelets of 359.  Mild hypokalemia of 3.4 encourage patient to increase potassium in diet, glucose of 158, patient not fasting prior to arrival.  Reviewed lab work with patient, largely reassuring.  Will prescribe  higher dose steroid burst and hydroxyzine to help with itching and see if higher steroids improve rash at all.  Encourage patient that she could also use over-the-counter Pepcid to help with itching.  Follow-up with dermatology in 2 days as planned.  Final Clinical Impression(s) / ED Diagnoses Final diagnoses:  Rash    Rx / DC Orders ED Discharge Orders         Ordered    predniSONE (DELTASONE) 20 MG tablet  Daily     10/13/19 1453    hydrOXYzine (ATARAX/VISTARIL) 25 MG tablet  Every 6 hours     10/13/19 1453           Dartha Lodge, [place] 10/13/19 1553    Blane Ohara, MD 10/16/19 1615

## 2019-10-13 NOTE — Discharge Instructions (Signed)
Follow-up with dermatologist on Tuesday as planned.  You may benefit from taking a burst of higher dose steroids than what you are initially started on.  Please take 60 mg prednisone daily.  You can also use prescribed hydroxyzine to help with itching, you can also take over-the-counter Pepcid to help with itching.

## 2019-10-13 NOTE — ED Triage Notes (Signed)
Pt arrive with rash all over body has been on prednisone since Wednesday, also taking Zyrtec.

## 2019-10-23 ENCOUNTER — Encounter: Payer: Self-pay | Admitting: Allergy and Immunology

## 2019-10-23 ENCOUNTER — Ambulatory Visit (INDEPENDENT_AMBULATORY_CARE_PROVIDER_SITE_OTHER): Payer: Self-pay | Admitting: Allergy and Immunology

## 2019-10-23 ENCOUNTER — Other Ambulatory Visit: Payer: Self-pay

## 2019-10-23 VITALS — BP 110/64 | HR 96 | Temp 97.4°F | Resp 16 | Ht 59.0 in | Wt 187.2 lb

## 2019-10-23 DIAGNOSIS — L501 Idiopathic urticaria: Secondary | ICD-10-CM

## 2019-10-23 MED ORDER — FAMOTIDINE 20 MG PO TABS: 20.0000 mg | ORAL_TABLET | Freq: Two times a day (BID) | ORAL | 5 refills | Status: AC | PRN

## 2019-10-23 MED ORDER — LEVOCETIRIZINE DIHYDROCHLORIDE 5 MG PO TABS: 5.0000 mg | ORAL_TABLET | Freq: Every day | ORAL | 5 refills | Status: AC | PRN

## 2019-10-23 NOTE — Assessment & Plan Note (Addendum)
Unclear etiology. Skin tests to select environmental and food allergens were negative today despite a positive histamine control. NSAIDs and emotional stress commonly exacerbate urticaria but are not the underlying etiology in this case. Physical urticarias are negative by history (i.e. pressure-induced, temperature, vibration, solar, etc.). History and lesions are not consistent with urticaria pigmentosa so I am not suspicious for mastocytosis. There are no concomitant symptoms concerning for anaphylaxis or constitutional symptoms worrisome for an underlying malignancy.  For symptom relief, patient is to take oral antihistamines as directed.  Instructions have been discussed and provided for H1/H2 receptor blockade with titration to find lowest effective dose.  Should symptoms recur, a journal is to be kept recording any foods eaten, beverages consumed, medications taken within a 6 hour period prior to the onset of symptoms, as well as record activities being performed, and environmental conditions. For any symptoms concerning for anaphylaxis, 911 is to be called immediately.  If this problem persists or progresses, return to dermatologist for further evaluation with skin biopsy.

## 2019-10-23 NOTE — Progress Notes (Signed)
New Patient Note  RE: Brenda Villa MRN: [id number] DOB: [birth year] Date of Office Visit: 10/23/2019  Referring provider: No ref. provider found Primary care provider: Patient, No Pcp Per  Chief Complaint: Urticaria and Rash  History of present illness: Brenda Villa is a 54 y.o. female presenting today for evaluation of rash.  Over the past 4 weeks, Brenda Villa has experienced recurrent episodes of hives. The hives have appeared at different times over her entire body.  The lesions are described as erythematous, raised, and pruritic.  Individual hives last less than 24 hours without leaving residual pigmentation or bruising. She denies concomitant angioedema, cardiopulmonary symptoms and GI symptoms. She has not experienced unexpected weight loss, recurrent fevers or drenching night sweats. No specific medication, food, skin care product, detergent, soap, or other environmental triggers have been identified. The symptoms do not seem to correlate with NSAIDs use or emotional stress. She did not have symptoms consistent with a respiratory tract infection at the time of symptom onset. Brenda Villa has tried to control symptoms with systemic steroids which have offered excellent temporary relief. She has been evaluated and treated in the emergency department for these symptoms.  She was evaluated by dermatologist, however skin biopsy was not performed.   Assessment and plan: Idiopathic urticaria Unclear etiology. Skin tests to select environmental and food allergens were negative today despite a positive histamine control. NSAIDs and emotional stress commonly exacerbate urticaria but are not the underlying etiology in this case. Physical urticarias are negative by history (i.e. pressure-induced, temperature, vibration, solar, etc.). History and lesions are not consistent with urticaria pigmentosa so I am not suspicious for mastocytosis. There are no concomitant symptoms concerning for anaphylaxis or  constitutional symptoms worrisome for an underlying malignancy.  For symptom relief, patient is to take oral antihistamines as directed.  Instructions have been discussed and provided for H1/H2 receptor blockade with titration to find lowest effective dose.  Should symptoms recur, a journal is to be kept recording any foods eaten, beverages consumed, medications taken within a 6 hour period prior to the onset of symptoms, as well as record activities being performed, and environmental conditions. For any symptoms concerning for anaphylaxis, 911 is to be called immediately.  If this problem persists or progresses, return to dermatologist for further evaluation with skin biopsy.   Meds ordered this encounter  Medications  . famotidine (PEPCID) 20 MG tablet    Sig: Take 1 tablet (20 mg total) by mouth 2 (two) times daily as needed for heartburn or indigestion.    Dispense:  60 tablet    Refill:  5  . levocetirizine (XYZAL) 5 MG tablet    Sig: Take 1 tablet (5 mg total) by mouth daily as needed for allergies.    Dispense:  30 tablet    Refill:  5    Diagnostics: Environmental skin testing: Negative despite a positive histamine control. Food allergen skin testing: Negative despite a positive histamine control.    Physical examination: Blood pressure 110/64, pulse 96, temperature (!) 97.4 F (36.3 C), temperature source Oral, resp. rate 16, height 4\' 11"  (1.499 m), weight 187 lb 2.7 oz (84.9 kg), SpO2 99 %.  General: Alert, interactive, in no acute distress. Neck: Supple without lymphadenopathy. Lungs: Clear to auscultation without wheezing, rhonchi or rales. CV: Normal S1, S2 without murmurs. Abdomen: Nondistended, nontender. Skin: Small, scattered, erythematous urticarial type lesions primarily located upper extremities , nonvesicular. Extremities:  No clubbing, cyanosis or edema. Neuro:   Grossly intact.  Review  of systems:  Review of systems negative except as noted in HPI /  PMHx or noted below: Review of Systems  Constitutional: Negative.   HENT: Negative.   Eyes: Negative.   Respiratory: Negative.   Cardiovascular: Negative.   Gastrointestinal: Negative.   Genitourinary: Negative.   Musculoskeletal: Negative.   Skin: Negative.   Neurological: Negative.   Endo/Heme/Allergies: Negative.   Psychiatric/Behavioral: Negative.     Past medical history:  History reviewed. No pertinent past medical history.  Past surgical history:  Past Surgical History:  Procedure Laterality Date  . ABDOMINAL HYSTERECTOMY      Family history: Family History  Problem Relation Age of Onset  . Angioedema Neg Hx   . Asthma Neg Hx   . Eczema Neg Hx   . Immunodeficiency Neg Hx   . Urticaria Neg Hx     Social history: Social History   Socioeconomic History  . Marital status: Single    Spouse name: Not on file  . Number of children: Not on file  . Years of education: Not on file  . Highest education level: Not on file  Occupational History  . Not on file  Tobacco Use  . Smoking status: Never Smoker  . Smokeless tobacco: Never Used  Substance and Sexual Activity  . Alcohol use: Never  . Drug use: Never  . Sexual activity: Not on file  Other Topics Concern  . Not on file  Social History Narrative  . Not on file   Social Determinants of Health   Financial Resource Strain:   . Difficulty of Paying Living Expenses:   Food Insecurity:   . Worried About Charity fundraiser in the Last Year:   . Arboriculturist in the Last Year:   Transportation Needs:   . Film/video editor (Medical):   Marland Kitchen Lack of Transportation (Non-Medical):   Physical Activity:   . Days of Exercise per Week:   . Minutes of Exercise per Session:   Stress:   . Feeling of Stress :   Social Connections:   . Frequency of Communication with Friends and Family:   . Frequency of Social Gatherings with Friends and Family:   . Attends Religious Services:   . Active Member of Clubs or  Organizations:   . Attends Archivist Meetings:   Marland Kitchen Marital Status:   Intimate Partner Violence:   . Fear of Current or Ex-Partner:   . Emotionally Abused:   Marland Kitchen Physically Abused:   . Sexually Abused:     Environmental History: The patient lives in a house built in 2003 with carpeting throughout, gassy, and central air.  There is no known mold/water damage in the home.  There are no pets in the home.  She is a non-smoker.  Current Outpatient Medications  Medication Sig Dispense Refill  . Cholecalciferol 25 MCG (1000 UT) tablet Take by mouth.    . famotidine (PEPCID) 20 MG tablet Take 1 tablet (20 mg total) by mouth 2 (two) times daily as needed for heartburn or indigestion. 60 tablet 5  . hydrOXYzine (ATARAX/VISTARIL) 25 MG tablet Take 1 tablet (25 mg total) by mouth every 6 (six) hours. (Patient not taking: Reported on 10/23/2019) 12 tablet 0  . levocetirizine (XYZAL) 5 MG tablet Take 1 tablet (5 mg total) by mouth daily as needed for allergies. 30 tablet 5   No current facility-administered medications for this visit.    Known medication allergies: Allergies  Allergen Reactions  . Propoxyphene  N-Acetaminophen   . Tramadol Hcl     REACTION: faint  . Adhesive  [Tape] Rash    I appreciate the opportunity to take part in Kimball's care. Please do not hesitate to contact me with questions.  Sincerely,   R. Jorene Guest, MD

## 2019-10-23 NOTE — Patient Instructions (Addendum)
Idiopathic urticaria Unclear etiology. Skin tests to select environmental and food allergens were negative today despite a positive histamine control. NSAIDs and emotional stress commonly exacerbate urticaria but are not the underlying etiology in this case. Physical urticarias are negative by history (i.e. pressure-induced, temperature, vibration, solar, etc.). History and lesions are not consistent with urticaria pigmentosa so I am not suspicious for mastocytosis. There are no concomitant symptoms concerning for anaphylaxis or constitutional symptoms worrisome for an underlying malignancy.  For symptom relief, patient is to take oral antihistamines as directed.  Instructions have been discussed and provided for H1/H2 receptor blockade with titration to find lowest effective dose.  Should symptoms recur, a journal is to be kept recording any foods eaten, beverages consumed, medications taken within a 6 hour period prior to the onset of symptoms, as well as record activities being performed, and environmental conditions. For any symptoms concerning for anaphylaxis, 911 is to be called immediately.  If this problem persists or progresses, return to dermatologist for further evaluation with skin biopsy.   If this problem persists or progresses, return to dermatology for further evaluation with skin biopsy.   Urticaria (Hives)  . Levocetirizine (Xyzal) 5 mg twice a day and famotidine (Pepcid) 20 mg twice a day. If no symptoms for 7-14 days then decrease to. . Levocetirizine (Xyzal) 5 mg twice a day and famotidine (Pepcid) 20 mg once a day.  If no symptoms for 7-14 days then decrease to. . Levocetirizine (Xyzal) 5 mg twice a day.  If no symptoms for 7-14 days then decrease to. . Levocetirizine (Xyzal) 5 mg once a day.  May use Benadryl (diphenhydramine) as needed for breakthrough symptoms       If symptoms return, then step up dosage

## 2023-06-23 LAB — HEMOGLOBIN A1C: Hemoglobin A1C: 10

## 2023-06-23 LAB — POCT ABI - SCREENING FOR PILOT NO CHARGE
Left ABI: 0.99
Right ABI: 1.08

## 2023-06-23 NOTE — Progress Notes (Signed)
 Pt was recommended to see a md. PT A1C was 10.0. Cholestorol, LDL was high. Nurse referred pt to nutrition classes and exercise classes.PT DOES NOT WANT FOLLOW UP DONE.

## 2023-07-27 ENCOUNTER — Other Ambulatory Visit: Payer: Self-pay | Admitting: Internal Medicine

## 2023-07-27 DIAGNOSIS — Z1231 Encounter for screening mammogram for malignant neoplasm of breast: Secondary | ICD-10-CM

## 2023-08-02 NOTE — Progress Notes (Signed)
 Pt attended 06/23/23 screening event with BP of 104/72 & A1C of 10.0, pt is diabetic. Pt did not note at event if she has a PCP or insurance. At event pt indicated food, housing, and utility SDOH needs. Pt also noted that she is not a smoker.  Per initial f/u pt was reached out via phone (3/19 & 3/21) and was left vm. [facility] contacted possible PCP office Ethelene Browns Younger; [facility]) and front desk confirmed that she was a current pt but was unable to release any other information regarding the pt.  Per chart review pt does have a PCP, insurance, and is not a smoker.  Pt indicated SDOH needs at event, but per 08/02/23 encounter pt no longer indicates any SDOH needs.  No additional pt f/u to be scheduled at this time per health equity protocol

## 2023-08-15 ENCOUNTER — Ambulatory Visit
Admission: RE | Admit: 2023-08-15 | Discharge: 2023-08-15 | Disposition: A | Discharge status: Home / Self Care | Source: Ambulatory Visit | Attending: Internal Medicine | Admitting: Internal Medicine

## 2023-08-15 DIAGNOSIS — Z1231 Encounter for screening mammogram for malignant neoplasm of breast: Secondary | ICD-10-CM

## 2023-11-21 NOTE — Progress Notes (Signed)
 Hematology/Oncology Initial Visit  Patient Name:  Brenda Villa Date of Birth:  1966/02/19 Date of Encounter:  11/21/2023  Reason for consultation: Beta thalassemia minor    Referring provider:  Kendall Cathlyn CROME, MD, [phone]  History of present illness:   Brenda Villa is a 58 y.o. female who presents for initial consultation on 11/21/2023 for further evaluation and management regarding beta thalassemia minor.  After interviewing the patient and reviewing the medical record, it appears her primary care provider through the [facility] medical system obtain a hemoglobin electrophoresis on 11/09/2023 which revealed a HBG A2 of 4.4%; the hemoglobin pattern and concentrations were consistent with beta thalassemia minor.  A CBC dating back to 12/09/2007 revealed red cell microcytosis with an MCV of 61.8 FL.  Her hemoglobin level at that time was 11.9 g/dL.  Since then, her hemoglobin level has ranged between 11.9 g/dL to normal limits at about 12 g/dL.  Her platelet count has been normal.  Recently her white blood cell count was noted to be elevated on 09/14/2023 with normal differentials.  A C-reactive protein was also obtained which was elevated at 29.7.  Brenda Villa has a past medical history consisting of type 2 diabetes and hyperlipidemia.  She is not routinely/chronically taking any oral steroids.  The patient denies any history of malignancy and has never received chemotherapy or radiation treatments.  She tells me she was diagnosed with COVID several years ago and since then has experienced very persistent and chronic fatigue.  She also tells me previously she was told she had fibromyalgia.  On examination, the patient states she continues to experience ongoing fatigue that she states can be debilitating at times.  She denies any symptoms of angina.  She denies any signs or symptoms indicative of active gastrointestinal bleeding including any melena, hematemesis, or bright red blood per rectum.  She  denies any presyncopal or syncopal episodes.  She denies any other new systemic symptoms.  Past Medical History Medical History[1]   Past Surgical History Surgical History[2]   Review of Systems: All other systems are negative.  Medications: Current Medications[3]  Past medical history, allergies, current medications, social history and family history were reviewed and updated when appropriate.  Physical Examination:  Vital Signs: BP 117/79   Pulse 99   Temp 98.7 F (37.1 C)   Resp 16   Ht 1.518 m (4' 11.75)   Wt 80.2 kg (176 lb 14.4 oz)   SpO2 97%   BMI 34.84 kg/m  General: Pleasant and well-appearing female sitting comfortably in the exam room. HEENT: Normocephalic, atraumatic. PERRLA, EOMI, sclera anicteric.  Nose without discharge.   Cardiovascular: Regular rate and rhythm, no audible murmurs, gallops, or rubs.  No JVD noted on examination.   Respiratory: Clear to auscultation bilaterally, lungs percuss normally. No respiratory distress. Patient speaks in complete sentences. Musculoskeletal: No bony pain or tenderness.  Extremities:  No edema or suspicious rashes.  No cyanosis or clubbing. Skin: No pathologic appearing petechiae or bruising noted. Neurologic: Alert and oriented to person, place, time and circumstance.   Psychiatric: Mood and affect are normal. Speech is fluent.  Results:   Results for orders placed or performed in visit on 11/21/23  Ferritin   Collection Time: 11/21/23  1:38 PM  Result Value Ref Range   Ferritin 132 11 - 307 ng/mL  Iron And Total Iron Binding Capacity   Collection Time: 11/21/23  1:38 PM  Result Value Ref Range   Iron 60 50 -  212 ug/dL   Transferrin 732 796 - 362 mg/dL   Total Iron Binding Capacity (TIBC) 382 290 - 518 ug/dL   Transferrin Saturation 16 15 - 45 %  CBC with Differential   Collection Time: 11/21/23  1:38 PM  Result Value Ref Range   WBC 13.15 (H) 4.40 - 11.00 10*3/uL   RBC 6.34 (H) 4.10 - 5.10 10*6/uL    Hemoglobin 11.8 (L) 12.3 - 15.3 g/dL   Hematocrit 61.8 64.0 - 44.6 %   Mean Corpuscular Volume (MCV) 60.0 (L) 80.0 - 96.0 fL   Mean Corpuscular Hemoglobin (MCH) 18.6 (L) 27.5 - 33.2 pg   Mean Corpuscular Hemoglobin Conc (MCHC) 31.0 (L) 33.0 - 37.0 g/dL   Red Cell Distribution Width (RDW) 16.7 12.3 - 17.0 %   Platelet Count (PLT) 310 150 - 450 10*3/uL   Mean Platelet Volume (MPV) 8.5 6.8 - 10.2 fL   Neutrophils % 72 %   Lymphocytes % 23 %   Monocytes % 3 %   Eosinophils % 2 %   Basophils % 1 %   Neutrophils Absolute 9.50 (H) 1.80 - 7.80 10*3/uL   Lymphocytes # 3.00 1.00 - 4.80 10*3/uL   Monocytes # 0.40 0.00 - 0.80 10*3/uL   Eosinophils # 0.30 0.00 - 0.50 10*3/uL   Basophils # 0.10 0.00 - 0.20 10*3/uL  Sedimentation Rate (ESR)   Collection Time: 11/21/23  1:38 PM  Result Value Ref Range   Sedimentation Rate 80 (H) 0 - 30 mm/hr  Rheumatoid Factor   Collection Time: 11/21/23  1:38 PM  Result Value Ref Range   Rheumatoid Factor <7.0 <12.5 IU/mL    Hemoglobin electrophoresis results obtained through an outside facility on 11/09/2023    Diagnosis:   1.  Beta thalassemia minor  2.  Reactive leukocytosis   Recommendation and Plan:   1. Beta thalassemia minor: Ms. Angelini had a hemoglobin electrophoresis performed through her primary medical provider on 11/09/2023 which revealed a HBG A2 of 4.4%; the hemoglobin pattern and concentrations are consistent with beta thalassemia minor.  I reviewed in detail the natural history and biology of the thalassemias with the patient today.  Her hemoglobin level today is 11.8 g/dL with an MCV of 60.  We also discussed to the importance of taking supplements only when appropriate, specifically iron.  It is possible in the future she will be advised by providers to take oral iron because of the microcytic anemia.  It is important she make sure she has documented iron deficiency before taking any iron supplementation.  Based on the pathophysiology of  thalassemia it may lead to morbidity from iron overload with supplementation when not needed.  Currently, the patient is iron replete with an iron of 60 mcg/dL, percent saturation 16, and ferritin 132 ng/mL.  The nature of beta thalassemia trait, and with her near normal hemoglobin level, there is no indication for any treatment for long-term monitoring.  Ms. Murdoch does states she has been experiencing chronic fatigue especially since she had COVID several years ago.  Again, the excessive fatigue she is experiencing does not appear to be secondary to an underlying hematological source.  I do advise she keep her upcoming appointment with rheumatology and continue to keep close follow-up with her primary care provider for routine repeat labs including a CBC once yearly.    2.  Reactive leukocytosis: The patient's white blood cell count is elevated at 13.15 with an absolute neutrophil count of 9500.  A recent C-reactive protein obtained  on 09/14/2023 was elevated at 29.7 mg/L indicating an underlying inflammatory state that is likely causing the leukocytosis and neutrophilia.  This is also confirmed with an elevated sedimentation rate of 80 mm/h.  Again, the patient does have an upcoming appointment with rheumatology which was placed by her endocrinologist in May after the elevated C-reactive protein was noted.   There is no indication to undergo any additional testing including peripheral blood flow cytometry, BCR/ABL transcript testing, or a bone marrow biopsy.  [facility] is an understanding and in agreement with these plans.  In total, 60 minutes of time was spent on this encounter, greater than 50% of which was spent face-to-face with Lavonia Zada Glendia for the history, physical exam, assessment and formulation of treatment plan.           [1] Past Medical History: Diagnosis Date  . Post op infection   [2] Past Surgical History: Procedure Laterality Date  . HYSTERECTOMY      Procedure:  HYSTERECTOMY  [3]  Current Outpatient Medications:  .  cholecalciferol (VITAMIN D3) 1,000 unit (25 mcg) tablet, Take 1,000 Units by mouth daily., Disp: , Rfl:

## 2023-12-26 NOTE — Progress Notes (Signed)
 This Document was created using the aid of voice recognition Dragon dictation software.  HPI: Diabetes type 2: Patient's blood sugar control is improving.    Patient reports that she is trying to control her diabetes with dietary and lifestyle modifications.  Patient has not started taking the metformin yet. Patient denies any nausea or vomiting.  Diet/Exercise: Patient is following diet. EYE EXAM: Patient reports that she needs a new ophthalmologist for diabetic eye exam.  Hyperlipidemia: The patient's cholesterol is well-controlled.   The following portions of the patient's history were reviewed and updated as appropriate: allergies, current medications, past medical history, past social history and problem list.  Current Medications[1]  Allergies[2]  Patient Active Problem List   Diagnosis Date Noted   . Type 2 diabetes mellitus without complication, with long-term current use of insulin    (CMD) 09/12/2023  . Mixed hyperlipidemia 09/12/2023  . Adhesive capsulitis of right shoulder 03/25/2018    Resolved Problems  No resolved problems to display.    ROS: Appropriate review of system was done and it was negative except for as mentioned in HPI.   VITALS: Vitals:   12/26/23 1038  BP: 108/77  Pulse: 76     LABS/RADIOLOGY/MEDICAL RECORDS:   HbA1c is 7.8 today which is better than last time however it is still uncontrolled. C-peptide levels are positive.  Patient has type 2 diabetes.    Recent Results (from the past 16 weeks)  POC Glucose (Hemocue/NOVA)   Collection Time: 09/12/23  9:21 AM  Result Value Ref Range   Glucose, POC 162 (A) 70 - 125 mg/dL   Kit/Device Lot # [id number]    Kit/Device Expiration Date 12/09/23   POC Hemoglobin A1c   Collection Time: 09/12/23  9:59 AM  Result Value Ref Range   Hemoglobin A1c (POC) 8.3 (A) 4.2 - 5.6 %   A1C Information      A1C Diagnostic Criteria: Prediabetes:5.7% - 6.4% Diabetes:>=6.5% A1C Glycemic Goals: Older Adults: <7.0%  Children and Adolescents: <7.0% Pregnant Women: <6.0%   Kit/Device Lot # [id number]    Kit/Device Expiration Date 03/27/25   Insulin Autoantibodies (IAA)   Collection Time: 09/21/23  3:08 PM  Result Value Ref Range   Insulin Antibodies <5.0 uU/mL  Glutamic Acid Decarboxylase (GAD65) Antibody   Collection Time: Sep 21, 2023  3:08 PM  Result Value Ref Range   GAD65 Ab Assay, Serum 0.00 <=0.02 nmol/L  Fructosamine   Collection Time: 09/21/23  3:08 PM  Result Value Ref Range   Fructosamine 308 (H) 0 - 285 umol/L  C-peptide   Collection Time: 09-21-2023  3:08 PM  Result Value Ref Range   C-Peptide 6.6 (H) 0.8 - 3.9 ng/mL  Albumin, Random Urine   Collection Time: 2023/09/21  3:08 PM  Result Value Ref Range   Albumin, Urine 32 Not Established mg/L   Creatinine, Urine 256 >=20 mg/dL   Albumin/Creatinine Ratio, Urine 13 0 - 30 mg/g creat  Comprehensive Metabolic Panel   Collection Time: Sep 21, 2023  3:08 PM  Result Value Ref Range   Sodium 138 136 - 145 mmol/L   Potassium 4.2 3.5 - 5.1 mmol/L   Chloride 101 98 - 107 mmol/L   CO2 27 21 - 31 mmol/L   Anion Gap 10 6 - 14 mmol/L   Glucose, Random 153 (H) 70 - 99 mg/dL   Blood Urea Nitrogen (BUN) 22 7 - 25 mg/dL   Creatinine 9.16 9.39 - 1.20 mg/dL   eGFR 83 >40 fO/fpw/8.26f7   Albumin 4.5 3.5 -  5.7 g/dL   Total Protein 8.0 6.4 - 8.9 g/dL   Bilirubin, Total 0.5 0.3 - 1.0 mg/dL   Alkaline Phosphatase (ALP) 106 (H) 34 - 104 U/L   Aspartate Aminotransferase (AST) 28 13 - 39 U/L   Alanine Aminotransferase (ALT) 26 7 - 52 U/L   Calcium 9.5 8.6 - 10.3 mg/dL   BUN/Creatinine Ratio    TSH   Collection Time: 09/14/23  3:08 PM  Result Value Ref Range   TSH 3.023 0.450 - 5.330 uIU/mL  T4, Free   Collection Time: 09/14/23  3:08 PM  Result Value Ref Range   T4, Free 0.7 0.6 - 1.3 ng/dL  C-Reactive Protein (CRP)   Collection Time: 09/14/23  3:08 PM  Result Value Ref Range   C-Reactive Protein (CRP) 29.7 (H) <5.0 mg/L  CBC with Differential    Collection Time: 09/14/23  3:08 PM  Result Value Ref Range   WBC 11.30 (H) 4.40 - 11.00 10*3/uL   RBC 6.53 (H) 4.10 - 5.10 10*6/uL   Hemoglobin 12.3 12.3 - 15.3 g/dL   Hematocrit 59.8 64.0 - 44.6 %   Mean Corpuscular Volume (MCV) 61.4 (L) 80.0 - 96.0 fL   Mean Corpuscular Hemoglobin (MCH) 18.9 (L) 27.5 - 33.2 pg   Mean Corpuscular Hemoglobin Conc (MCHC) 30.7 (L) 33.0 - 37.0 g/dL   Red Cell Distribution Width (RDW) 16.2 12.3 - 17.0 %   Platelet Count (PLT) 301 150 - 450 10*3/uL   Mean Platelet Volume (MPV) 8.7 6.8 - 10.2 fL   Neutrophils % 63 %   Lymphocytes % 30 %   Monocytes % 5 %   Eosinophils % 2 %   Basophils % 1 %   nRBC % 0 %   Neutrophils Absolute 7.30 1.80 - 7.80 10*3/uL   Lymphocytes # 3.40 1.00 - 4.80 10*3/uL   Monocytes # 0.50 0.00 - 0.80 10*3/uL   Eosinophils # 0.20 0.00 - 0.50 10*3/uL   Basophils # 0.10 0.00 - 0.20 10*3/uL   nRBC Absolute 0.00 <=0.00 10*3/uL  Ferritin   Collection Time: 11/21/23  1:38 PM  Result Value Ref Range   Ferritin 132 11 - 307 ng/mL  Iron And Total Iron Binding Capacity   Collection Time: 11/21/23  1:38 PM  Result Value Ref Range   Iron 60 50 - 212 ug/dL   Transferrin 732 796 - 362 mg/dL   Total Iron Binding Capacity (TIBC) 382 290 - 518 ug/dL   Transferrin Saturation 16 15 - 45 %  CBC with Differential   Collection Time: 11/21/23  1:38 PM  Result Value Ref Range   WBC 13.15 (H) 4.40 - 11.00 10*3/uL   RBC 6.34 (H) 4.10 - 5.10 10*6/uL   Hemoglobin 11.8 (L) 12.3 - 15.3 g/dL   Hematocrit 61.8 64.0 - 44.6 %   Mean Corpuscular Volume (MCV) 60.0 (L) 80.0 - 96.0 fL   Mean Corpuscular Hemoglobin (MCH) 18.6 (L) 27.5 - 33.2 pg   Mean Corpuscular Hemoglobin Conc (MCHC) 31.0 (L) 33.0 - 37.0 g/dL   Red Cell Distribution Width (RDW) 16.7 12.3 - 17.0 %   Platelet Count (PLT) 310 150 - 450 10*3/uL   Mean Platelet Volume (MPV) 8.5 6.8 - 10.2 fL   Neutrophils % 72 %   Lymphocytes % 23 %   Monocytes % 3 %   Eosinophils % 2 %   Basophils % 1 %    Neutrophils Absolute 9.50 (H) 1.80 - 7.80 10*3/uL   Lymphocytes # 3.00 1.00 - 4.80  10*3/uL   Monocytes # 0.40 0.00 - 0.80 10*3/uL   Eosinophils # 0.30 0.00 - 0.50 10*3/uL   Basophils # 0.10 0.00 - 0.20 10*3/uL  Sedimentation Rate (ESR)   Collection Time: 11/21/23  1:38 PM  Result Value Ref Range   Sedimentation Rate 80 (H) 0 - 30 mm/hr  Antinuclear Antibody, Hep-2 Substrate,IgG   Collection Time: 11/21/23  1:38 PM  Result Value Ref Range   Antinuclear Antibody,Hep-2 Substrate Positive 1:160 (A) <1:80 (Negative)   Ana Titer 1:160    Ana Pattern Homogeneous   Rheumatoid Factor   Collection Time: 11/21/23  1:38 PM  Result Value Ref Range   Rheumatoid Factor <7.0 <12.5 IU/mL  POC Hemoglobin A1c   Collection Time: 12/26/23 10:47 AM  Result Value Ref Range   Hemoglobin A1c (POC) 7.8 (A) 4.2 - 5.6 %   A1C Information      A1C Diagnostic Criteria: Prediabetes:5.7% - 6.4% Diabetes:>=6.5% A1C Glycemic Goals: Older Adults: <7.0% Children and Adolescents: <7.0% Pregnant Women: <6.0%   Kit/Device Lot # [id number]    Kit/Device Expiration Date 08/29/25   POC Glucose (Hemocue/NOVA)   Collection Time: 12/26/23 10:48 AM  Result Value Ref Range   Glucose, POC 180 (A) 70 - 125 mg/dL   Kit/Device Lot # [id number]    Kit/Device Expiration Date 07/31/25      ORDERS: Orders Placed This Encounter  Procedures  . Ambulatory referral to Ophthalmology  . Ambulatory referral to Podiatry  . POC Glucose (Hemocue/NOVA)  . POC Hemoglobin A1c   Orders Placed This Encounter  Medications  . metFORMIN (GLUCOPHAGE) 500 mg tablet    Sig: Take 1 tablet (500 mg total) by mouth in the morning and 1 tablet (500 mg total) in the evening. Take with meals.    Dispense:  180 tablet    Refill:  1     ASSESSMENT/PLAN: 1. Type 2 diabetes mellitus without complication, with long-term current use of insulin    (CMD) (Primary) I had a detailed discussion about further management of diabetes with the patient.  I have  advised the patient to start taking metformin regularly.  Hopefully patient's diabetes will improve with that.  I have refilled the prescription of 500 mg 1 tablet twice daily.  Continue with dietary and lifestyle modifications.  I will refer the patient to ophthalmology and podiatry.  - POC Glucose (Hemocue/NOVA) - POC Hemoglobin A1c - Ambulatory referral to Ophthalmology; Future - Ambulatory referral to Podiatry; Future  2. Mixed hyperlipidemia Continue the patient on current cholesterol regimen.     This Document was created using the aid of voice recognition Dragon dictation software.       [1]  Current Outpatient Medications:  .  cholecalciferol (VITAMIN D3) 1,000 unit (25 mcg) tablet, Take 1,000 Units by mouth daily., Disp: , Rfl:  .  metFORMIN (GLUCOPHAGE) 500 mg tablet, Take 1 tablet (500 mg total) by mouth in the morning and 1 tablet (500 mg total) in the evening. Take with meals., Disp: 180 tablet, Rfl: 1 [2] Allergies Allergen Reactions  . Propoxyphene N-Acetaminophen Other (See Comments)  . Tramadol Hcl Other (See Comments)    REACTION: faint  . Adhesive Rash  . Darvocet-N 100 Rash  . Latex Rash  . Wygesic Rash

## 2024-01-01 NOTE — Progress Notes (Addendum)
 Rheumatology at [facility] Date of Service: 01/01/2024    Patient Name: Brenda Villa Patient DOB: May 12, 1966  Vital Signs BP 109/77 (BP Location: Left arm, Patient Position: Sitting)   Pulse 86   Ht 1.499 m (4' 11)   Wt 81.6 kg (180 lb)   SpO2 97%   BMI 36.36 kg/m   Chief Complaint/HPI Skylinn Vialpando is here for new patient consultation for elevated CRP and positive ANA, referred by Elizbeth Blanch, MD  Pt c/o diffuse arthralgia, muscle cramps, severe fatigue. Symptoms since Covid PNA in 2021 and getting worse.  Difficulty opening bottles or lifting objects. She feels unstable in feet. Numbness off and on in hands and feet. Sleep apnea. Diabetes mellitus. GERD. Asthma.  Serologies: 11/21/2023 RF-neg ESR 80 CRP 29   Hx of cancers: no   Physical Exam, New Patient Pt is alert and oriented.  Answers questions appropriately  Eyes: pupils reactive to light. There is no conjunctivitis. Mouth: moist moth mucosa  Neck: no abnormal masses visible. Heart: normal S1, S2. No S3, no thrills. No JVD. Lungs: no dyspnea observed, normal breath sounds on auscultation.  Extremities: no cyanosis, no edema, no calves tenderness. Skin: warm, dry. Hyperpigmented macular rash around lips. Neuro: Gait normal. Motor functions intact, sensory functions intact. Musculoskeletal exam:  No hot, swollen joints. Normal muscle strength.  Assessment 1. Arthralgia, unspecified joint  CK, Total   C-Reactive Protein (CRP)   Sedimentation Rate (ESR)   Urinalysis with Reflex to Microscopic    2. Bilateral hand pain  NCV/EMG/Ultrasound   XR Hand 2 Views Left   XR Hand 2 Views Right    Patient with high inflammatory markers. I do not see symptoms of a rheumatic condition that could cause it.We will do tests as listed above. Pt advised to have age appropriate cancer screening tests done.  Plan/Orders Return in about 6 months (around 07/03/2024).  I have personally spent 45 minutes involved in  face-to-face and non-face-to-face activities for this patient on the day of the visit.   Professional time spent includes face-to-face meeting with the patient, documentation of clinical information in patient's records,    PMH Medical History[1]  PSH Surgical History[2]  Allergies Propoxyphene n-acetaminophen, Tramadol hcl, Adhesive, Darvocet-n 100, Latex, and Wygesic  Medications Medications Ordered Prior to Encounter[3]  Family Hx Family History[4]  Social Hx  Social History[5]   International aid/development worker. I Devere Marcine Shams, RMA am acting as scribe for Dr. Aldona ziolkowska, 01/01/2024, 4:56 PM        [1] Past Medical History: Diagnosis Date  . Post op infection   [2] Past Surgical History: Procedure Laterality Date  . HYSTERECTOMY      Procedure: HYSTERECTOMY  [3] Current Outpatient Medications on File Prior to Visit  Medication Sig Dispense Refill  . cholecalciferol (VITAMIN D3) 1,000 unit (25 mcg) tablet Take 1,000 Units by mouth daily.    . metFORMIN (GLUCOPHAGE) 500 mg tablet Take 1 tablet (500 mg total) by mouth in the morning and 1 tablet (500 mg total) in the evening. Take with meals. 180 tablet 1   No current facility-administered medications on file prior to visit.  [4] Family History Adopted: Yes  Problem Relation Name Age of Onset  . Breast cancer Neg Hx    [5] Social History Tobacco Use  . Smoking status: Never  . Smokeless tobacco: Never  Substance Use Topics  . Alcohol use: Not Currently  . Drug use: Not Currently
